# Patient Record
Sex: Female | Born: 1965 | Race: White | Hispanic: No | Marital: Single | Smoking: Current every day smoker
Health system: Southern US, Community
[De-identification: ages and names within clinical notes are randomized; demographics above are authoritative.]

## PROBLEM LIST (undated history)

## (undated) DIAGNOSIS — I10 Essential (primary) hypertension: Secondary | ICD-10-CM

---

## 1998-02-10 ENCOUNTER — Emergency Department (HOSPITAL_COMMUNITY): Admission: EM | Admit: 1998-02-10 | Discharge: 1998-02-10 | Payer: Self-pay | Admitting: Emergency Medicine

## 1998-09-03 ENCOUNTER — Other Ambulatory Visit: Admission: RE | Admit: 1998-09-03 | Discharge: 1998-09-03 | Payer: Self-pay | Admitting: *Deleted

## 1998-09-04 ENCOUNTER — Encounter: Payer: Self-pay | Admitting: Emergency Medicine

## 1998-09-04 ENCOUNTER — Emergency Department (HOSPITAL_COMMUNITY): Admission: EM | Admit: 1998-09-04 | Discharge: 1998-09-04 | Payer: Self-pay | Admitting: Emergency Medicine

## 1999-08-21 ENCOUNTER — Emergency Department (HOSPITAL_COMMUNITY): Admission: EM | Admit: 1999-08-21 | Discharge: 1999-08-21 | Payer: Self-pay | Admitting: *Deleted

## 1999-08-23 ENCOUNTER — Emergency Department (HOSPITAL_COMMUNITY): Admission: EM | Admit: 1999-08-23 | Discharge: 1999-08-23 | Payer: Self-pay | Admitting: *Deleted

## 2000-10-09 ENCOUNTER — Emergency Department (HOSPITAL_COMMUNITY): Admission: EM | Admit: 2000-10-09 | Discharge: 2000-10-09 | Payer: Self-pay | Admitting: Emergency Medicine

## 2001-06-02 ENCOUNTER — Emergency Department (HOSPITAL_COMMUNITY): Admission: EM | Admit: 2001-06-02 | Discharge: 2001-06-02 | Payer: Self-pay | Admitting: Emergency Medicine

## 2001-06-02 ENCOUNTER — Encounter: Payer: Self-pay | Admitting: Emergency Medicine

## 2001-12-24 ENCOUNTER — Encounter: Payer: Self-pay | Admitting: Emergency Medicine

## 2001-12-24 ENCOUNTER — Emergency Department (HOSPITAL_COMMUNITY): Admission: EM | Admit: 2001-12-24 | Discharge: 2001-12-24 | Payer: Self-pay | Admitting: Emergency Medicine

## 2002-01-13 ENCOUNTER — Emergency Department (HOSPITAL_COMMUNITY): Admission: EM | Admit: 2002-01-13 | Discharge: 2002-01-13 | Payer: Self-pay | Admitting: *Deleted

## 2002-12-27 ENCOUNTER — Emergency Department (HOSPITAL_COMMUNITY): Admission: EM | Admit: 2002-12-27 | Discharge: 2002-12-27 | Payer: Self-pay | Admitting: Emergency Medicine

## 2005-09-27 ENCOUNTER — Emergency Department (HOSPITAL_COMMUNITY): Admission: EM | Admit: 2005-09-27 | Discharge: 2005-09-27 | Payer: Self-pay | Admitting: Emergency Medicine

## 2006-07-12 ENCOUNTER — Emergency Department (HOSPITAL_COMMUNITY): Admission: EM | Admit: 2006-07-12 | Discharge: 2006-07-12 | Payer: Self-pay | Admitting: Emergency Medicine

## 2006-07-15 ENCOUNTER — Emergency Department (HOSPITAL_COMMUNITY): Admission: EM | Admit: 2006-07-15 | Discharge: 2006-07-15 | Payer: Self-pay | Admitting: Emergency Medicine

## 2008-10-14 ENCOUNTER — Emergency Department (HOSPITAL_COMMUNITY): Admission: EM | Admit: 2008-10-14 | Discharge: 2008-10-14 | Payer: Self-pay | Admitting: Emergency Medicine

## 2010-12-20 ENCOUNTER — Emergency Department (HOSPITAL_COMMUNITY): Payer: Self-pay

## 2010-12-20 ENCOUNTER — Inpatient Hospital Stay (INDEPENDENT_AMBULATORY_CARE_PROVIDER_SITE_OTHER)
Admission: RE | Admit: 2010-12-20 | Discharge: 2010-12-20 | Disposition: A | Discharge status: Home / Self Care | Payer: Self-pay | Source: Ambulatory Visit | Attending: Family Medicine | Admitting: Family Medicine

## 2010-12-20 ENCOUNTER — Emergency Department (HOSPITAL_COMMUNITY)
Admission: EM | Admit: 2010-12-20 | Discharge: 2010-12-20 | Disposition: A | Discharge status: Home / Self Care | Payer: Self-pay | Attending: Emergency Medicine | Admitting: Emergency Medicine

## 2010-12-20 DIAGNOSIS — R509 Fever, unspecified: Secondary | ICD-10-CM | POA: Insufficient documentation

## 2010-12-20 DIAGNOSIS — B9789 Other viral agents as the cause of diseases classified elsewhere: Secondary | ICD-10-CM | POA: Insufficient documentation

## 2010-12-20 DIAGNOSIS — J329 Chronic sinusitis, unspecified: Secondary | ICD-10-CM | POA: Insufficient documentation

## 2010-12-20 DIAGNOSIS — R1031 Right lower quadrant pain: Secondary | ICD-10-CM | POA: Insufficient documentation

## 2010-12-20 DIAGNOSIS — R51 Headache: Secondary | ICD-10-CM | POA: Insufficient documentation

## 2010-12-20 DIAGNOSIS — R5381 Other malaise: Secondary | ICD-10-CM | POA: Insufficient documentation

## 2010-12-20 DIAGNOSIS — R110 Nausea: Secondary | ICD-10-CM | POA: Insufficient documentation

## 2010-12-20 DIAGNOSIS — J3489 Other specified disorders of nose and nasal sinuses: Secondary | ICD-10-CM | POA: Insufficient documentation

## 2010-12-20 DIAGNOSIS — R05 Cough: Secondary | ICD-10-CM | POA: Insufficient documentation

## 2010-12-20 DIAGNOSIS — R5383 Other fatigue: Secondary | ICD-10-CM | POA: Insufficient documentation

## 2010-12-20 DIAGNOSIS — IMO0001 Reserved for inherently not codable concepts without codable children: Secondary | ICD-10-CM | POA: Insufficient documentation

## 2010-12-20 DIAGNOSIS — R059 Cough, unspecified: Secondary | ICD-10-CM | POA: Insufficient documentation

## 2010-12-20 LAB — CBC
Hemoglobin: 14.7 g/dL (ref 12.0–15.0)
MCH: 31.3 pg (ref 26.0–34.0)
MCV: 92.8 fL (ref 78.0–100.0)
Platelets: 86 10*3/uL — ABNORMAL LOW (ref 150–400)
RBC: 4.69 MIL/uL (ref 3.87–5.11)
RDW: 12.2 % (ref 11.5–15.5)
WBC: 2.1 10*3/uL — ABNORMAL LOW (ref 4.0–10.5)

## 2010-12-20 LAB — DIFFERENTIAL
Eosinophils Absolute: 0 10*3/uL (ref 0.0–0.7)
Eosinophils Relative: 0 % (ref 0–5)
Lymphocytes Relative: 44 % (ref 12–46)
Monocytes Relative: 13 % — ABNORMAL HIGH (ref 3–12)
Neutro Abs: 0.8 10*3/uL — ABNORMAL LOW (ref 1.7–7.7)

## 2010-12-20 LAB — POCT I-STAT, CHEM 8
BUN: <3 mg/dL — ABNORMAL LOW (ref 6–23)
HCT: 45 % (ref 36.0–46.0)
Hemoglobin: 15.3 g/dL — ABNORMAL HIGH (ref 12.0–15.0)
TCO2: 26 mmol/L (ref 0–100)

## 2010-12-20 LAB — POCT PREGNANCY, URINE: Preg Test, Ur: NEGATIVE

## 2010-12-20 LAB — URINALYSIS, ROUTINE W REFLEX MICROSCOPIC
Hgb urine dipstick: NEGATIVE
Urobilinogen, UA: 0.2 mg/dL (ref 0.0–1.0)

## 2010-12-23 LAB — PATHOLOGIST SMEAR REVIEW

## 2010-12-30 LAB — POCT I-STAT, CHEM 8
BUN: 8 mg/dL (ref 6–23)
Chloride: 107 mEq/L (ref 96–112)
Potassium: 4.2 mEq/L (ref 3.5–5.1)
Sodium: 139 mEq/L (ref 135–145)

## 2014-04-23 ENCOUNTER — Emergency Department: Payer: Self-pay | Admitting: Emergency Medicine

## 2014-04-23 LAB — COMPREHENSIVE METABOLIC PANEL
ALK PHOS: 70 U/L
ANION GAP: 13 (ref 7–16)
Albumin: 4.3 g/dL (ref 3.4–5.0)
BILIRUBIN TOTAL: 0.5 mg/dL (ref 0.2–1.0)
BUN: 10 mg/dL (ref 7–18)
CHLORIDE: 102 mmol/L (ref 98–107)
Calcium, Total: 8.9 mg/dL (ref 8.5–10.1)
Co2: 21 mmol/L (ref 21–32)
Creatinine: 0.45 mg/dL — ABNORMAL LOW (ref 0.60–1.30)
EGFR (African American): >60
EGFR (Non-African Amer.): >60
Glucose: 88 mg/dL (ref 65–99)
Osmolality: 270 (ref 275–301)
Potassium: 4.5 mmol/L (ref 3.5–5.1)
SGOT(AST): 39 U/L — ABNORMAL HIGH (ref 15–37)
SGPT (ALT): 27 U/L
Sodium: 136 mmol/L (ref 136–145)
TOTAL PROTEIN: 8.5 g/dL — AB (ref 6.4–8.2)

## 2014-04-23 LAB — CBC WITH DIFFERENTIAL/PLATELET
BASOS ABS: 0.1 10*3/uL (ref 0.0–0.1)
Basophil %: 1 %
EOS ABS: 0.1 10*3/uL (ref 0.0–0.7)
Eosinophil %: 0.9 %
HCT: 45.3 % (ref 35.0–47.0)
HGB: 15.6 g/dL (ref 12.0–16.0)
LYMPHS PCT: 24.2 %
Lymphocyte #: 1.9 10*3/uL (ref 1.0–3.6)
MCH: 33.3 pg (ref 26.0–34.0)
MCHC: 34.4 g/dL (ref 32.0–36.0)
MCV: 97 fL (ref 80–100)
MONO ABS: 0.6 x10 3/mm (ref 0.2–0.9)
MONOS PCT: 7.4 %
Neutrophil #: 5.2 10*3/uL (ref 1.4–6.5)
Neutrophil %: 66.5 %
Platelet: 365 10*3/uL (ref 150–440)
RBC: 4.68 10*6/uL (ref 3.80–5.20)
RDW: 12.3 % (ref 11.5–14.5)
WBC: 7.8 10*3/uL (ref 3.6–11.0)

## 2014-04-23 LAB — TROPONIN I: Troponin-I: <0.02 ng/mL

## 2014-04-23 LAB — LIPASE, BLOOD: LIPASE: 203 U/L (ref 73–393)

## 2014-04-24 LAB — URINALYSIS, COMPLETE
Bilirubin,UR: NEGATIVE
GLUCOSE, UR: NEGATIVE mg/dL (ref 0–75)
Leukocyte Esterase: NEGATIVE
Nitrite: NEGATIVE
PH: 5 (ref 4.5–8.0)
PROTEIN: NEGATIVE
SQUAMOUS EPITHELIAL: NONE SEEN
Specific Gravity: 1.011 (ref 1.003–1.030)
WBC UR: <1 /HPF (ref 0–5)

## 2018-07-28 ENCOUNTER — Ambulatory Visit: Payer: Self-pay | Admitting: Primary Care

## 2020-03-22 ENCOUNTER — Emergency Department
Admission: EM | Admit: 2020-03-22 | Discharge: 2020-03-22 | Disposition: A | Discharge status: Home / Self Care | Payer: Self-pay | Attending: Emergency Medicine | Admitting: Emergency Medicine

## 2020-03-22 ENCOUNTER — Emergency Department: Payer: Self-pay

## 2020-03-22 ENCOUNTER — Other Ambulatory Visit: Payer: Self-pay

## 2020-03-22 ENCOUNTER — Encounter: Payer: Self-pay | Admitting: Emergency Medicine

## 2020-03-22 DIAGNOSIS — M79602 Pain in left arm: Secondary | ICD-10-CM

## 2020-03-22 DIAGNOSIS — M25512 Pain in left shoulder: Secondary | ICD-10-CM | POA: Insufficient documentation

## 2020-03-22 DIAGNOSIS — M791 Myalgia, unspecified site: Secondary | ICD-10-CM | POA: Insufficient documentation

## 2020-03-22 LAB — CBC
HCT: 44.6 % (ref 36.0–46.0)
Hemoglobin: 15.4 g/dL — ABNORMAL HIGH (ref 12.0–15.0)
MCH: 32 pg (ref 26.0–34.0)
MCHC: 34.5 g/dL (ref 30.0–36.0)
MCV: 92.5 fL (ref 80.0–100.0)
Platelets: 306 10*3/uL (ref 150–400)
RBC: 4.82 MIL/uL (ref 3.87–5.11)
RDW: 12.2 % (ref 11.5–15.5)
WBC: 7.7 10*3/uL (ref 4.0–10.5)
nRBC: 0 % (ref 0.0–0.2)

## 2020-03-22 LAB — BASIC METABOLIC PANEL
Anion gap: 10 (ref 5–15)
BUN: 10 mg/dL (ref 6–20)
CO2: 25 mmol/L (ref 22–32)
Calcium: 9.1 mg/dL (ref 8.9–10.3)
Chloride: 105 mmol/L (ref 98–111)
Creatinine, Ser: 0.68 mg/dL (ref 0.44–1.00)
GFR calc Af Amer: >60 mL/min (ref >60)
GFR calc non Af Amer: >60 mL/min (ref >60)
Glucose, Bld: 107 mg/dL — ABNORMAL HIGH (ref 70–99)
Potassium: 4.1 mmol/L (ref 3.5–5.1)
Sodium: 140 mmol/L (ref 135–145)

## 2020-03-22 LAB — TROPONIN I (HIGH SENSITIVITY): Troponin I (High Sensitivity): 2 ng/L (ref <18)

## 2020-03-22 MED ORDER — SODIUM CHLORIDE 0.9% FLUSH: 3.0000 mL | Freq: Once | INTRAVENOUS | Status: DC

## 2020-03-22 MED ORDER — IBUPROFEN 600 MG PO TABS
600.0000 mg | ORAL_TABLET | Freq: Three times a day (TID) | ORAL | 0 refills | Status: DC | PRN
Start: 2020-03-22 — End: 2020-03-22

## 2020-03-22 MED ORDER — DIAZEPAM 5 MG PO TABS
5.0000 mg | ORAL_TABLET | Freq: Three times a day (TID) | ORAL | 0 refills | Status: DC | PRN
Start: 2020-03-22 — End: 2020-03-22

## 2020-03-22 MED ORDER — DIAZEPAM 5 MG PO TABS: 5.0000 mg | ORAL_TABLET | Freq: Three times a day (TID) | ORAL | 0 refills | Status: AC | PRN

## 2020-03-22 MED ORDER — IBUPROFEN 600 MG PO TABS
600.0000 mg | ORAL_TABLET | Freq: Three times a day (TID) | ORAL | 0 refills | Status: AC | PRN
Start: 2020-03-22 — End: ?

## 2020-03-22 NOTE — ED Notes (Signed)
Pt signed esignature.  D/c  inst to pt.  

## 2020-03-22 NOTE — ED Provider Notes (Signed)
ER Provider Note       Time seen: 4:10 PM    I have reviewed the vital signs and the nursing notes.  HISTORY   Chief Complaint Arm Pain and Jaw Pain   HPI Stacy Griffin is a 54 y.o. female with no known past medical history who presents today for left arm pain for the last 2 days.  Patient states this morning the pain radiates into her neck.  Discomfort is 8 out of 10, describes it as an aching.  She feels a popping sensation in the left shoulder.  History reviewed. No pertinent past medical history.  History reviewed. No pertinent surgical history.  Allergies Patient has no known allergies.  Review of Systems Constitutional: Negative for fever. Cardiovascular: Negative for chest pain. Respiratory: Negative for shortness of breath. Gastrointestinal: Negative for abdominal pain, vomiting and diarrhea. Musculoskeletal: Positive for left arm and shoulder pain Skin: Negative for rash. Neurological: Negative for headaches, focal weakness or numbness.  All systems negative/normal/unremarkable except as stated in the HPI  ____________________________________________   PHYSICAL EXAM:  VITAL SIGNS: Vitals:   03/22/20 1205 03/22/20 1214  BP: (!) 194/95   Pulse: (!) 105   Resp:  16  Temp: 98.7 F (37.1 C)   SpO2: 100%     Constitutional: Alert and oriented. Well appearing and in no distress. Eyes: Conjunctivae are normal. Normal extraocular movements. ENT      Head: Normocephalic and atraumatic.      Nose: No congestion/rhinnorhea.      Mouth/Throat: Mucous membranes are moist.      Neck: No stridor. Cardiovascular: Normal rate, regular rhythm. No murmurs, rubs, or gallops. Respiratory: Normal respiratory effort without tachypnea nor retractions. Breath sounds are clear and equal bilaterally. No wheezes/rales/rhonchi. Gastrointestinal: Soft and nontender. Normal bowel sounds Musculoskeletal: Pain with range of motion of the left shoulder, crepitus is noted,  tenderness around the left trapezius muscle group is noted.  No anatomical radiculopathy is noted in the left upper extremity. Neurologic:  Normal speech and language. No gross focal neurologic deficits are appreciated.  Skin:  Skin is warm, dry and intact. No rash noted. Psychiatric: Speech and behavior are normal.  ____________________________________________  EKG: Interpreted by me.  Sinus rhythm with rate of 96 bpm, normal PR interval, normal QRS, normal QT  ____________________________________________   LABS (pertinent positives/negatives)  Labs Reviewed  BASIC METABOLIC PANEL - Abnormal; Notable for the following components:      Result Value   Glucose, Bld 107 (*)    All other components within normal limits  CBC - Abnormal; Notable for the following components:   Hemoglobin 15.4 (*)    All other components within normal limits  POC URINE PREG, ED  TROPONIN I (HIGH SENSITIVITY)  TROPONIN I (HIGH SENSITIVITY)    RADIOLOGY  Chest x-ray is unremarkable  DIFFERENTIAL DIAGNOSIS  Musculoskeletal pain, spasm, anxiety, cervical radiculopathy, MI, dissection  ASSESSMENT AND PLAN  Musculoskeletal pain   Plan: The patient had presented for musculoskeletal pain which seems to be combination of arthritis and muscle spasm in and around the left shoulder. Patient's labs were reassuring, heart score is low risk for ACS.  She is cleared for outpatient follow-up with her doctor.  Daryel November MD    Note: This note was generated in part or whole with voice recognition software. Voice recognition is usually quite accurate but there are transcription errors that can and very often do occur. I apologize for any typographical errors that were not detected  and corrected.     Emily Filbert, MD 03/22/20 [phone]

## 2020-03-22 NOTE — ED Triage Notes (Signed)
C/O constant left arm pain x 2 days.  STates this morning pain now radiates up neck to back of head.    AAOx3.  Skin warm and dry. NAD

## 2020-03-22 NOTE — ED Notes (Signed)
Pt reports left arm pain for 2 days.  No chest pain or sob.  No n/v/d  No cough of rever.  Pt in hallway bed.  Pt alert   Speech clear.

## 2021-06-12 ENCOUNTER — Emergency Department: Payer: Self-pay

## 2021-06-12 ENCOUNTER — Emergency Department
Admission: EM | Admit: 2021-06-12 | Discharge: 2021-06-12 | Disposition: A | Discharge status: Home / Self Care | Payer: Self-pay | Attending: Emergency Medicine | Admitting: Emergency Medicine

## 2021-06-12 ENCOUNTER — Other Ambulatory Visit: Payer: Self-pay

## 2021-06-12 DIAGNOSIS — R42 Dizziness and giddiness: Secondary | ICD-10-CM

## 2021-06-12 DIAGNOSIS — I10 Essential (primary) hypertension: Secondary | ICD-10-CM | POA: Insufficient documentation

## 2021-06-12 DIAGNOSIS — R55 Syncope and collapse: Secondary | ICD-10-CM

## 2021-06-12 DIAGNOSIS — F17200 Nicotine dependence, unspecified, uncomplicated: Secondary | ICD-10-CM | POA: Insufficient documentation

## 2021-06-12 DIAGNOSIS — R200 Anesthesia of skin: Secondary | ICD-10-CM | POA: Insufficient documentation

## 2021-06-12 LAB — CBC
HCT: 49.9 % — ABNORMAL HIGH (ref 36.0–46.0)
Hemoglobin: 17.3 g/dL — ABNORMAL HIGH (ref 12.0–15.0)
MCH: 32.8 pg (ref 26.0–34.0)
MCHC: 34.7 g/dL (ref 30.0–36.0)
MCV: 94.7 fL (ref 80.0–100.0)
Platelets: 276 10*3/uL (ref 150–400)
RBC: 5.27 MIL/uL — ABNORMAL HIGH (ref 3.87–5.11)
RDW: 12.4 % (ref 11.5–15.5)
WBC: 6 10*3/uL (ref 4.0–10.5)
nRBC: 0 % (ref 0.0–0.2)

## 2021-06-12 LAB — BASIC METABOLIC PANEL
Anion gap: 11 (ref 5–15)
BUN: 10 mg/dL (ref 6–20)
CO2: 22 mmol/L (ref 22–32)
Calcium: 9 mg/dL (ref 8.9–10.3)
Chloride: 108 mmol/L (ref 98–111)
Creatinine, Ser: 0.77 mg/dL (ref 0.44–1.00)
GFR, Estimated: >60 mL/min (ref >60)
Glucose, Bld: 114 mg/dL — ABNORMAL HIGH (ref 70–99)
Potassium: 4 mmol/L (ref 3.5–5.1)
Sodium: 141 mmol/L (ref 135–145)

## 2021-06-12 MED ORDER — LOSARTAN POTASSIUM 25 MG PO TABS: 25.0000 mg | ORAL_TABLET | Freq: Every day | ORAL | 1 refills | Status: AC

## 2021-06-12 MED ORDER — MECLIZINE HCL 25 MG PO TABS
25.0000 mg | ORAL_TABLET | Freq: Once | ORAL | Status: AC
  Administered 2021-06-12: 25 mg via ORAL
  Filled 2021-06-12: qty 1

## 2021-06-12 MED ORDER — IOHEXOL 350 MG/ML SOLN
75.0000 mL | Freq: Once | INTRAVENOUS | Status: AC | PRN
  Administered 2021-06-12: 75 mL via INTRAVENOUS
  Filled 2021-06-12: qty 75

## 2021-06-12 MED ORDER — LOSARTAN POTASSIUM 50 MG PO TABS
25.0000 mg | ORAL_TABLET | Freq: Every day | ORAL | Status: DC
  Administered 2021-06-12: 25 mg via ORAL
  Filled 2021-06-12: qty 1

## 2021-06-12 NOTE — ED Triage Notes (Signed)
Pt to ED [facility] from work for near syncope after having dizziness. Denies cp.  Reports shob earlier.  Denies hx of HTN  MSE Darl Pikes PA

## 2021-06-12 NOTE — ED Provider Notes (Signed)
Emergency Medicine Provider Triage Evaluation Note  Stacy Griffin , a 55 y.o. female  was evaluated in triage.  Pt complains of dizziness and numbness tingling to both hands and feels weaker on the right than the left..  Review of Systems  Positive: Dizziness, weakness Negative: Headache, chest pain, shortness of breath, abdominal pain  Physical Exam  BP (!) 174/97   Pulse 91   Temp 97.8 F (36.6 C) (Oral)   Resp 18   Ht 5\' 2"  (1.575 m)   Wt 72.6 kg   SpO2 96%   BMI 29.26 kg/m  Gen:   Awake, no distress   Resp:  Normal effort  MSK:   Moves extremities without difficulty  Other:  PERRL, EOMI, multiple beats nystagmus noted bilaterally in the horizontal field  Medical Decision Making  Medically screening exam initiated at 11:52 AM.  Appropriate orders placed.  was informed that the remainder of the evaluation will be completed by another provider, this initial triage assessment does not replace that evaluation, and the importance of remaining in the ED until their evaluation is complete.     Sheilah Pigeon, PA-C 06/12/21 1152    06/14/21, MD 06/12/21 [phone]

## 2021-06-12 NOTE — ED Provider Notes (Signed)
[facility]-EMERGENCY DEPARTMENT  ____________________________________________  Time seen: Approximately 8:11 PM  I have reviewed the triage vital signs and the nursing notes.   HISTORY  Chief Complaint Dizziness   Historian Patient     HPI Stacy Griffin is a 55 y.o. female presents to the emergency department with concern for a possible episode of syncope that occurred at work.  Patient reports that she was experiencing a typical workday when she "blacked out".  Patient states that she experienced immediate confusion and did not know where she was.  She states that she had tingling in the bilateral hands and her speech was slurred.  She states that she had difficulty coordinating movements.  She states that her symptoms have improved significantly but she does not feel completely back to baseline.  Patient denies a history of hypertension and is unaware that her blood pressure was elevated.  Denies a history of DVT or PE in the past.  Denies chest pain, chest tightness or shortness of breath.  Denies current weakness in the arms and legs.  Denies experiencing similar symptoms in the past.   History reviewed. No pertinent past medical history.   Immunizations up to date:  Yes.     History reviewed. No pertinent past medical history.  There are no problems to display for this patient.   History reviewed. No pertinent surgical history.  Prior to Admission medications   Medication Sig Start Date End Date Taking? Authorizing Provider  losartan (COZAAR) 25 MG tablet Take 1 tablet (25 mg total) by mouth daily. 06/12/21 07/12/21 Yes Pia Mau M, PA-C  diazepam (VALIUM) 5 MG tablet Take 1 tablet (5 mg total) by mouth every 8 (eight) hours as needed for muscle spasms. Patient not taking: Reported on 06/12/2021 03/22/20   Emily Filbert, MD  ibuprofen (ADVIL) 600 MG tablet Take 1 tablet (600 mg total) by mouth every 8 (eight) hours as needed. Patient not taking: Reported on  06/12/2021 03/22/20   Emily Filbert, MD    Allergies Patient has no known allergies.  History reviewed. No pertinent family history.  Social History Social History   Tobacco Use   Smoking status: Every Day   Smokeless tobacco: Never  Substance Use Topics   Alcohol use: Not Currently   Drug use: Not Currently     Review of Systems  Constitutional: No fever/chills Eyes:  No discharge ENT: No upper respiratory complaints. Respiratory: no cough. No SOB/ use of accessory muscles to breath Gastrointestinal:   No nausea, no vomiting.  No diarrhea.  No constipation. Musculoskeletal: Negative for musculoskeletal pain. Neuro: Patient has headache.  Skin: Negative for rash, abrasions, lacerations, ecchymosis.    ____________________________________________   PHYSICAL EXAM:  VITAL SIGNS: ED Triage Vitals  Enc Vitals Group     BP 06/12/21 1145 (!) 174/97     Pulse Rate 06/12/21 1145 91     Resp 06/12/21 1145 18     Temp 06/12/21 1145 97.8 F (36.6 C)     Temp Source 06/12/21 1145 Oral     SpO2 06/12/21 1145 96 %     Weight 06/12/21 1146 160 lb (72.6 kg)     Height 06/12/21 1146 5\' 2"  (1.575 m)     Head Circumference --      Peak Flow --      Pain Score 06/12/21 1146 0     Pain Loc --      Pain Edu? --      Excl. in GC? --  Constitutional: Alert and oriented. Well appearing and in no acute distress. Eyes: Conjunctivae are normal. PERRL. EOMI. Head: Atraumatic. ENT:      Ears: Tms are pearly.       Nose: No congestion/rhinnorhea.      Mouth/Throat: Mucous membranes are moist.  Neck: No stridor.  No cervical spine tenderness to palpation. Cardiovascular: Normal rate, regular rhythm. Normal S1 and S2.  Good peripheral circulation. Respiratory: Normal respiratory effort without tachypnea or retractions. Lungs CTAB. Good air entry to the bases with no decreased or absent breath sounds Gastrointestinal: Bowel sounds x 4 quadrants. Soft and nontender to  palpation. No guarding or rigidity. No distention. Musculoskeletal: Patient has symmetric strength in the upper and lower extremities. Neurologic:  Normal for age. No gross focal neurologic deficits are appreciated.  Patient can perform rapid alternating movements.  Cranial nerves II through XII are intact. Skin:  Skin is warm, dry and intact. No rash noted. Psychiatric: Mood and affect are normal for age. Speech and behavior are normal.   ____________________________________________   LABS (all labs ordered are listed, but only abnormal results are displayed)  Labs Reviewed  BASIC METABOLIC PANEL - Abnormal; Notable for the following components:      Result Value   Glucose, Bld 114 (*)    All other components within normal limits  CBC - Abnormal; Notable for the following components:   RBC 5.27 (*)    Hemoglobin 17.3 (*)    HCT 49.9 (*)    All other components within normal limits  URINALYSIS, COMPLETE (UACMP) WITH MICROSCOPIC   ____________________________________________  EKG   ____________________________________________  RADIOLOGY Geraldo Pitter, personally viewed and evaluated these images (plain radiographs) as part of my medical decision making, as well as reviewing the written report by the radiologist.  CT ANGIO HEAD NECK W WO CM  Result Date: 06/12/2021 CLINICAL DATA:  Dizziness with numbness and tingling in both hands EXAM: CT ANGIOGRAPHY HEAD AND NECK TECHNIQUE: Multidetector CT imaging of the head and neck was performed using the standard protocol during bolus administration of intravenous contrast. Multiplanar CT image reconstructions and MIPs were obtained to evaluate the vascular anatomy. Carotid stenosis measurements (when applicable) are obtained utilizing NASCET criteria, using the distal internal carotid diameter as the denominator. CONTRAST:  60mL OMNIPAQUE IOHEXOL 350 MG/ML SOLN COMPARISON:  None. FINDINGS: CT HEAD FINDINGS Brain: There is no mass,  hemorrhage or extra-axial collection. The size and configuration of the ventricles and extra-axial CSF spaces are normal. There is no acute or chronic infarction. The brain parenchyma is normal. Skull: The visualized skull base, calvarium and extracranial soft tissues are normal. Sinuses/Orbits: No fluid levels or advanced mucosal thickening of the visualized paranasal sinuses. No mastoid or middle ear effusion. The orbits are normal. CTA NECK FINDINGS SKELETON: There is no bony spinal canal stenosis. No lytic or blastic lesion. OTHER NECK: Normal pharynx, larynx and major salivary glands. No cervical lymphadenopathy. Unremarkable thyroid gland. UPPER CHEST: No pneumothorax or pleural effusion. No nodules or masses. AORTIC ARCH: There is no calcific atherosclerosis of the aortic arch. There is no aneurysm, dissection or hemodynamically significant stenosis of the visualized portion of the aorta. Conventional 3 vessel aortic branching pattern. The visualized proximal subclavian arteries are widely patent. RIGHT CAROTID SYSTEM: No dissection, occlusion or aneurysm. Mild atherosclerotic calcification at the carotid bifurcation without hemodynamically significant stenosis. LEFT CAROTID SYSTEM: Normal without aneurysm, dissection or stenosis. VERTEBRAL ARTERIES: Left dominant configuration. Both origins are clearly patent. There is no dissection,  occlusion or flow-limiting stenosis to the skull base (V1-V3 segments). CTA HEAD FINDINGS POSTERIOR CIRCULATION: --Vertebral arteries: Normal V4 segments. --Inferior cerebellar arteries: Normal. --Basilar artery: Normal. --Superior cerebellar arteries: Normal. --Posterior cerebral arteries (PCA): Normal. ANTERIOR CIRCULATION: --Intracranial internal carotid arteries: Normal. --Anterior cerebral arteries (ACA): Normal. Both A1 segments are present. Patent anterior communicating artery (a-comm). --Middle cerebral arteries (MCA): Normal. VENOUS SINUSES: As permitted by contrast  timing, patent. ANATOMIC VARIANTS: None Review of the MIP images confirms the above findings. IMPRESSION: Normal CTA of the head and neck. Electronically Signed   By: Deatra Robinson M.D.   On: 06/12/2021 20:49   CT HEAD WO CONTRAST ( )  Result Date: 06/12/2021 CLINICAL DATA:  Dizziness and hypertension.  Tachycardia.  Syncope. EXAM: CT HEAD WITHOUT CONTRAST TECHNIQUE: Contiguous axial images were obtained from the base of the skull through the vertex without intravenous contrast. COMPARISON:  09/27/2005 FINDINGS: Brain: The brainstem, cerebellum, cerebral peduncles, thalami, basal ganglia, basilar cisterns, and ventricular system appear within normal limits. No intracranial hemorrhage, mass lesion, or acute CVA. Vascular: Unremarkable Skull: Unremarkable Sinuses/Orbits: Slightly asymmetric soft tissue prominence in the middle ear medial to the right ossicular chain on images 15-16 of series 3 probably represents a mildly asymmetrically prominent tensor tympani muscle rather than cholesteatoma or inflammation. I reviewed this appearance with Dr. Guadlupe Spanish, who concurred. Other: No supplemental non-categorized findings. IMPRESSION: 1. No significant abnormality identified. Electronically Signed   By: Gaylyn Rong M.D.   On: 06/12/2021 13:07    ____________________________________________    PROCEDURES  Procedure(s) performed:     Procedures     Medications  losartan (COZAAR) tablet 25 mg (25 mg Oral Given 06/12/21 2119)  meclizine (ANTIVERT) tablet 25 mg (25 mg Oral Given 06/12/21 1159)  iohexol (OMNIPAQUE) 350 MG/ML injection 75 mL (75 mLs Intravenous Contrast Given 06/12/21 2033)     ____________________________________________   INITIAL IMPRESSION / ASSESSMENT AND PLAN / ED COURSE  Pertinent labs & imaging results that were available during my care of the patient were reviewed by me and considered in my medical decision making (see chart for details).    Assessment and  plan Headache 55 year old female presents to the emergency department after an episode of syncope that occurred at work.  Patient reports that she blacked out momentarily and then afterwards experienced dizziness, confusion, slurred speech and difficulty coordinating her movements.  Patient was hypertensive at triage without other notable neurodeficits on exam.  CT head showed no evidence of intracranial bleed.  Patient declined MRI.  As patient does not currently feel back to baseline, will obtain CT angio head and neck.  I reviewed care plan with attending Dr. Vicente Males who agrees at this time.  Patient had a normal CTA of the head and neck.  Patient stated that she felt improved and was ready to be discharged.  I started patient on losartan daily until she can be seen by her primary care provider.  Return precautions were given to return with new or worsening symptoms.   ____________________________________________  FINAL CLINICAL IMPRESSION(S) / ED DIAGNOSES  Final diagnoses:  Dizziness  Syncope, unspecified syncope type  Hypertension, unspecified type      NEW MEDICATIONS STARTED DURING THIS VISIT:  ED Discharge Orders          Ordered    losartan (COZAAR) 25 MG tablet  Daily        06/12/21 2055                This chart  was dictated using voice recognition software/Dragon. Despite best efforts to proofread, errors can occur which can change the meaning. Any change was purely unintentional.     Gasper Lloyd 06/12/21 2322    Minna Antis, MD 06/13/21 1924

## 2021-06-12 NOTE — ED Notes (Signed)
Patient transported to CT 

## 2021-06-12 NOTE — Discharge Instructions (Addendum)
Take 25 mg of losartan once daily for hypertension.

## 2021-06-12 NOTE — ED Triage Notes (Signed)
Pt comes into the ED via [facility] from her work c/o hypertension.  Pt also presents tachycardic with a syncopal episode.  Pt does not have any known history of HTN.  Pt c/o dizziness, but denies any Chest pain or blurred vision.  CBG 120 12 lead, sinus tach 199/117 99% RA 110 HR

## 2022-03-18 ENCOUNTER — Emergency Department (HOSPITAL_COMMUNITY)
Admission: EM | Admit: 2022-03-18 | Discharge: 2022-03-18 | Disposition: A | Discharge status: Home / Self Care | Payer: Self-pay | Attending: Emergency Medicine | Admitting: Emergency Medicine

## 2022-03-18 ENCOUNTER — Emergency Department (HOSPITAL_COMMUNITY): Payer: Self-pay

## 2022-03-18 ENCOUNTER — Encounter (HOSPITAL_COMMUNITY): Payer: Self-pay

## 2022-03-18 ENCOUNTER — Other Ambulatory Visit: Payer: Self-pay

## 2022-03-18 DIAGNOSIS — S8251XA Displaced fracture of medial malleolus of right tibia, initial encounter for closed fracture: Secondary | ICD-10-CM | POA: Insufficient documentation

## 2022-03-18 DIAGNOSIS — S82831A Other fracture of upper and lower end of right fibula, initial encounter for closed fracture: Secondary | ICD-10-CM | POA: Insufficient documentation

## 2022-03-18 DIAGNOSIS — W109XXA Fall (on) (from) unspecified stairs and steps, initial encounter: Secondary | ICD-10-CM | POA: Insufficient documentation

## 2022-03-18 HISTORY — DX: Essential (primary) hypertension: I10

## 2022-03-18 MED ORDER — HYDROCODONE-ACETAMINOPHEN 5-325 MG PO TABS
1.0000 | ORAL_TABLET | Freq: Once | ORAL | Status: AC
  Administered 2022-03-18: 1 via ORAL
  Filled 2022-03-18: qty 1

## 2022-03-18 MED ORDER — HYDROCODONE-ACETAMINOPHEN 5-325 MG PO TABS: 1.0000 | ORAL_TABLET | Freq: Four times a day (QID) | ORAL | 0 refills | Status: AC | PRN

## 2022-03-18 NOTE — Progress Notes (Signed)
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  Ortho Devices Type of Ortho Device: Crutches Ortho Device/Splint Location: BLE Ortho Device/Splint Interventions: Ordered, Adjustment   Post Interventions Patient Tolerated: Well Instructions Provided: Poper ambulation with device  Jad Johansson A Esty Ahuja 03/18/2022, 4:50 PM

## 2022-03-18 NOTE — ED Triage Notes (Signed)
Pt states she was down the stairs and her flip flop got caught and she rolled both anklesx3 days. Pt states she has been having trouble walking since. Pt has 2+ swelling of ankle bilat. Pt has ecchymosis of anterior both ankles. Pt has 2+ pedal pulse bilat, warm to touch, cap refill less than 3 sec, pt able to wiggle toes.

## 2022-03-18 NOTE — ED Provider Notes (Signed)
[facility] EMERGENCY DEPARTMENT Provider Note   CSN: [id number] Arrival date & time: 03/18/22  1514     History  Chief Complaint  Patient presents with   Ankle Injury    Stacy Griffin is a 56 y.o. female with past medical history significant for hypertension who presents with concern for pain to bilateral ankles for the last 3 days.  Patient reports that she fell down the stairs, when her flip-flop got caught and rolled on both ankles.  Patient reports that she is been having difficulty walking since.  Patient reports that she has been taking 1000 mg of Tylenol every 6 hours with minimal relief.  She reports 6/10 pain at rest, and significant pain when trying to weight-bear.  She reports she knew something was wrong because it feels significantly worse than previous ankle sprains.  She denies any numbness, tingling of the feet or toes.   Ankle Injury       Home Medications Prior to Admission medications   Medication Sig Start Date End Date Taking? Authorizing Provider  HYDROcodone-acetaminophen (NORCO/VICODIN) 5-325 MG tablet Take 1-2 tablets by mouth every 6 (six) hours as needed. 03/18/22  Yes Kenyada Dosch H, PA-C  diazepam (VALIUM) 5 MG tablet Take 1 tablet (5 mg total) by mouth every 8 (eight) hours as needed for muscle spasms. Patient not taking: Reported on 06/12/2021 03/22/20   Earleen Newport, MD  ibuprofen (ADVIL) 600 MG tablet Take 1 tablet (600 mg total) by mouth every 8 (eight) hours as needed. Patient not taking: Reported on 06/12/2021 03/22/20   Earleen Newport, MD  losartan (COZAAR) 25 MG tablet Take 1 tablet (25 mg total) by mouth daily. 06/12/21 07/12/21  Lannie Fields, PA-C      Allergies    Patient has no known allergies.    Review of Systems   Review of Systems  Musculoskeletal:  Positive for gait problem and joint swelling.  All other systems reviewed and are negative.   Physical Exam Updated Vital Signs BP (!) 152/76  (BP Location: Right Arm)   Pulse 90   Temp 98.4 F (36.9 C) (Oral)   Resp 18   Ht 5\' 2"  (1.575 m)   Wt 72.6 kg   SpO2 96%   BMI 29.27 kg/m  Physical Exam Vitals and nursing note reviewed.  Constitutional:      General: She is not in acute distress.    Appearance: Normal appearance.  HENT:     Head: Normocephalic and atraumatic.  Eyes:     General:        Right eye: No discharge.        Left eye: No discharge.  Cardiovascular:     Rate and Rhythm: Normal rate and regular rhythm.     Pulses: Normal pulses.  Pulmonary:     Effort: Pulmonary effort is normal. No respiratory distress.  Musculoskeletal:        General: Swelling and deformity present.     Comments: Significant tenderness to lateral right ankle with soft tissue swelling, bruising.  Tenderness to palpation most focally medial left ankle additionally with some soft tissue swelling and bruising.  Some tenderness palpation along left-sided ATFL ligament as well.  Intact range of motion, intact strength to dorsiflexion, plantarflexion on bilateral ankles with some pain.  Patient can move all toes spontaneously.  Skin:    General: Skin is warm and dry.     Capillary Refill: Capillary refill takes less than 2 seconds.  Neurological:     Mental Status: She is alert and oriented to person, place, and time.     Sensory: No sensory deficit.  Psychiatric:        Mood and Affect: Mood normal.        Behavior: Behavior normal.     ED Results / Procedures / Treatments   Labs (all labs ordered are listed, but only abnormal results are displayed) Labs Reviewed - No data to display  EKG None  Radiology DG Ankle Complete Left  Result Date: 03/18/2022 CLINICAL DATA:  Bilateral ankle pain/swelling post "twisting" them x today. EXAM: LEFT ANKLE COMPLETE - 3+ VIEW COMPARISON:  None Available. FINDINGS: Acute minimally displaced medial malleolar avulsion fracture. No dislocation. Possible small joint effusion. There is no evidence  of arthropathy or other focal bone abnormality. Subcutaneus soft tissue edema. IMPRESSION: Acute minimally displaced medial malleolar avulsion fracture. Electronically Signed   By: Tish Frederickson M.D.   On: 03/18/2022 16:12   DG Ankle Complete Right  Result Date: 03/18/2022 CLINICAL DATA:  Bilateral ankle injury. EXAM: RIGHT ANKLE - COMPLETE 3+ VIEW COMPARISON:  None Available. FINDINGS: Oblique fracture of the distal fibula noted at the level of the ankle mortise. There is mild lateral distraction of the distal fragment without gross subluxation or dislocation at the tibiotalar joint. No fracture of the distal tibia. Soft tissue swelling evident. IMPRESSION: Oblique fracture of the distal fibula with mild lateral distraction of the distal fragment. Electronically Signed   By: Kennith Center M.D.   On: 03/18/2022 16:03    Procedures Procedures    Medications Ordered in ED Medications  HYDROcodone-acetaminophen (NORCO/VICODIN) 5-325 MG per tablet 1 tablet (has no administration in time range)    ED Course/ Medical Decision Making/ A&P                           Medical Decision Making  Is an overall well-appearing 56 year old female who presents with concern for bilateral ankle pain after fall 3 days ago, twisting injury.  Patient reports that she has been having significant pain with weightbearing.  My emergent differential diagnosis includes acute fracture, dislocation, or other injury of the ankle.  Also considered severe sprain.  Patient with significant past medical history of hypertension.  No past medical history of diabetes.  Patient with neurovascularly intact bilateral lower extremities, intact pulses, but significant tenderness and swelling, as well as bruising to lateral right ankle, as well as medial left ankle.  Patient can weight-bear with some difficulty.  Intact range of motion to plantarflexion, dorsiflexion bilaterally with some pain.   I independently interpreted imaging  including plain film radiographs of the right ankle, left ankle distal right fibula fracture with minor angulation of fractured fragment.  Very small avulsion fracture noted to left medial malleolus, consistent with severe sprain. I agree with the radiologist interpretation.  We will place patient in bilateral cam boot, encouraged ibuprofen, Tylenol, rest, ice, compression, elevation of affected extremities, minimal weightbearing as tolerated, and encourage close follow-up with orthopedics for rehab planning.  Patient will be discharged in stable condition, extensive return precautions given.  Final Clinical Impression(s) / ED Diagnoses Final diagnoses:  Other closed fracture of distal end of right fibula, initial encounter  Closed avulsion fracture of medial malleolus of right tibia, initial encounter    Rx / DC Orders ED Discharge Orders          Ordered    HYDROcodone-acetaminophen (NORCO/VICODIN) 5-325  MG tablet  Every 6 hours PRN        03/18/22 1651              Susana Duell, Edyth Gunnels 03/18/22 1652    Linwood Dibbles, MD 03/19/22 [id number]

## 2022-03-18 NOTE — Discharge Instructions (Signed)
Please use Tylenol or ibuprofen for pain.  You may use 600 mg ibuprofen every 6 hours or 1000 mg of Tylenol every 6 hours.  You may choose to alternate between the 2.  This would be most effective.  Not to exceed 4 g of Tylenol within 24 hours.  Not to exceed 3200 mg ibuprofen 24 hours.  You can use the narcotic pain medication I am prescribing to help with breakthrough pain, please take it in place of Tylenol in the above description as the narcotic pain medication already contains Tylenol.  Please follow-up with orthopedics at your earliest convenience for further evaluation and rehab planning.

## 2022-03-18 NOTE — ED Provider Triage Note (Signed)
Emergency Medicine Provider Triage Evaluation Note  Stacy Griffin , a 56 y.o. female  was evaluated in triage.  Pt complains of bilateral ankle pain after falling down the stairs on Saturday night. Tried using ice and elevating her ankles without relief. Able to bear weight with severe pain.   Review of Systems  Positive: Bilateral ankle pain and swelling Negative: Numbness   Physical Exam  BP (!) 152/76 (BP Location: Right Arm)   Pulse 90   Temp 98.4 F (36.9 C) (Oral)   Resp 18   SpO2 96%  Gen:   Awake, no distress   Resp:  Normal effort  MSK:   Moves extremities without difficulty  Other:  Bilateral soft tissue swelling of the ankles with diffuse ecchymoses, neurovascularly intact  Medical Decision Making  Medically screening exam initiated at 3:28 PM.  Appropriate orders placed.  Sheilah Pigeon was informed that the remainder of the evaluation will be completed by another provider, this initial triage assessment does not replace that evaluation, and the importance of remaining in the ED until their evaluation is complete.  Workup initiated including x-rays   Maela Takeda T, PA-C 03/18/22 1534

## 2022-06-02 IMAGING — CT CT HEAD W/O CM
3 series · 16 of 47 positions shown, 19 images · non-contrast
Comparison: 09/27/2005

CLINICAL DATA: Dizziness and hypertension.  Tachycardia.  Syncope.

EXAM:
CT HEAD WITHOUT CONTRAST
TECHNIQUE: Contiguous axial images were obtained from the base of the skull
through the vertex without intravenous contrast.

[Series 2: head wo · axial · 0.45mm/px · z∈[-78,+62]mm · 10 of 34 slices shown, 13 images]
[im 3/34  brain]
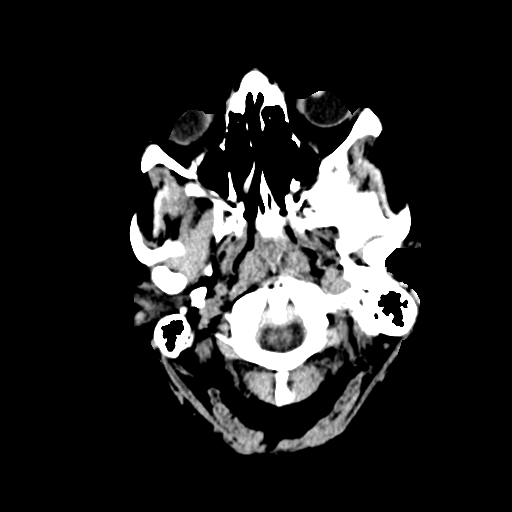
[im 3/34  bone]
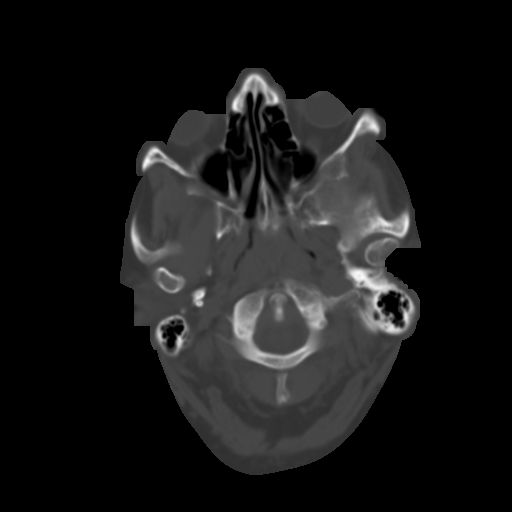
[im 6/34  brain]
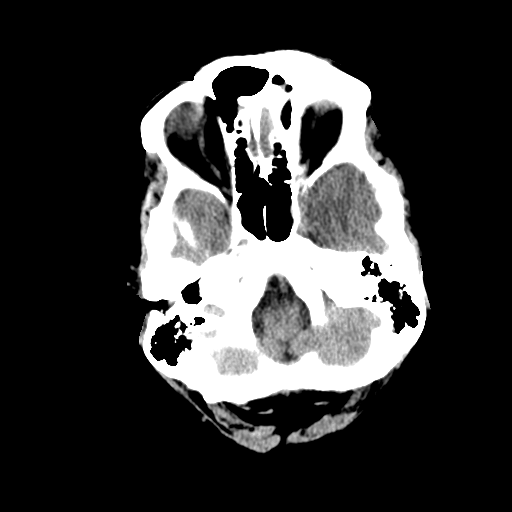
[im 10/34  brain]
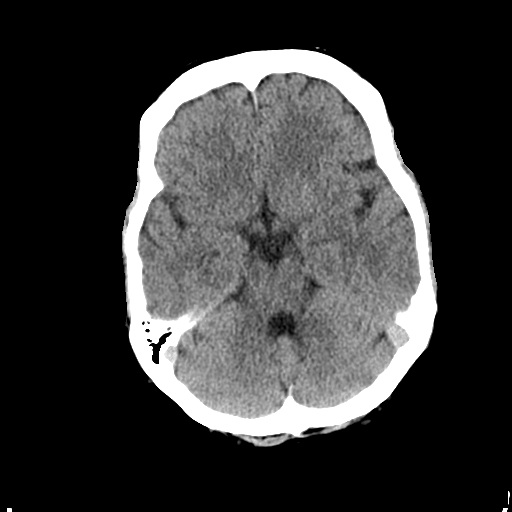
[im 12/34  brain]
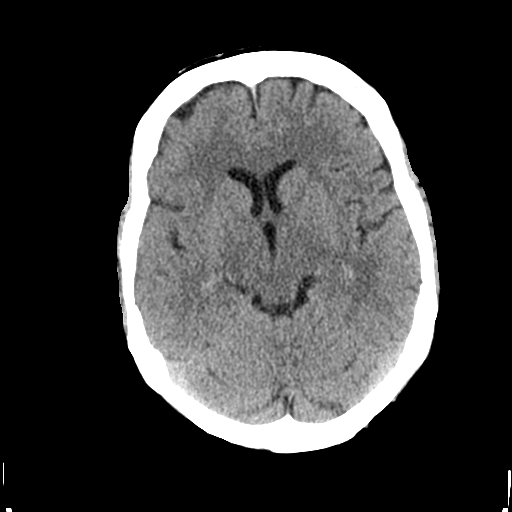
[im 15/34  brain]
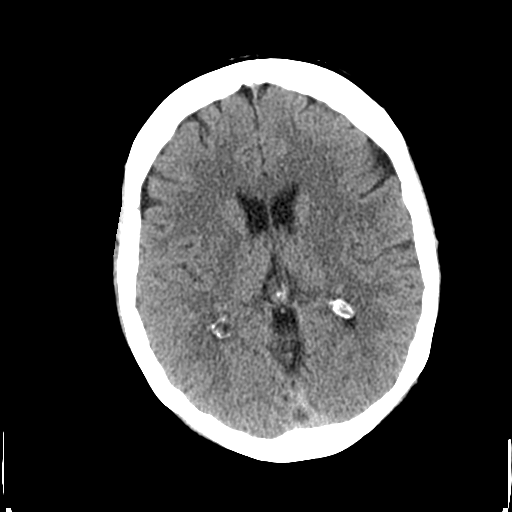
[im 15/34  bone]
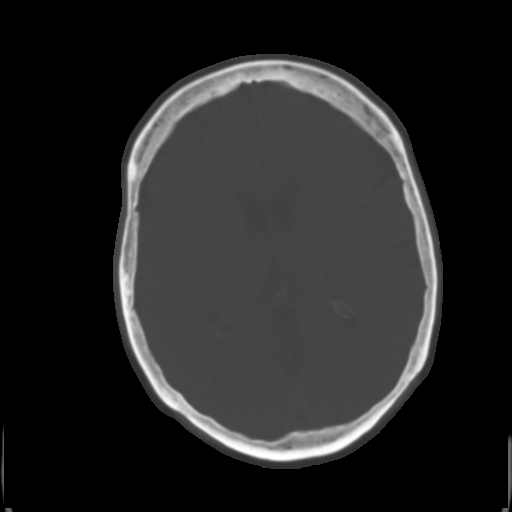
[im 19/34  brain]
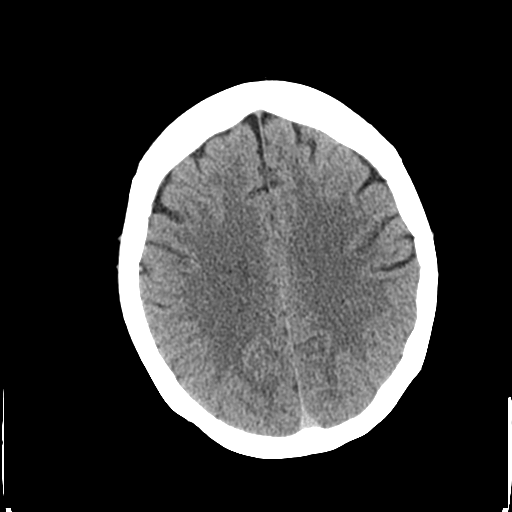
[im 22/34  brain]
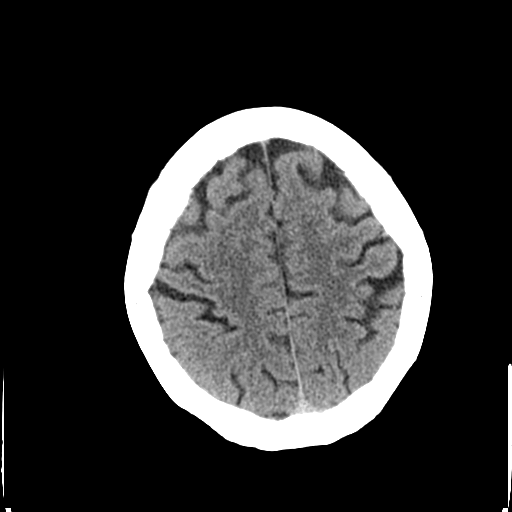
[im 26/34  brain]
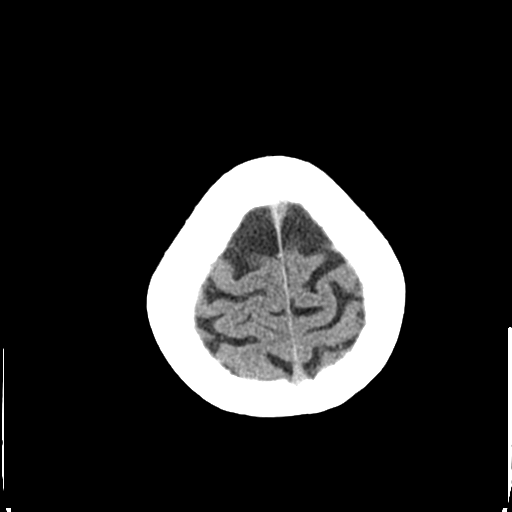
[im 28/34  brain]
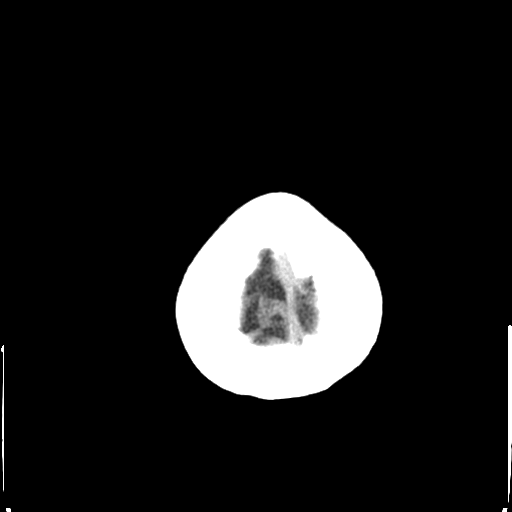
[im 28/34  bone]
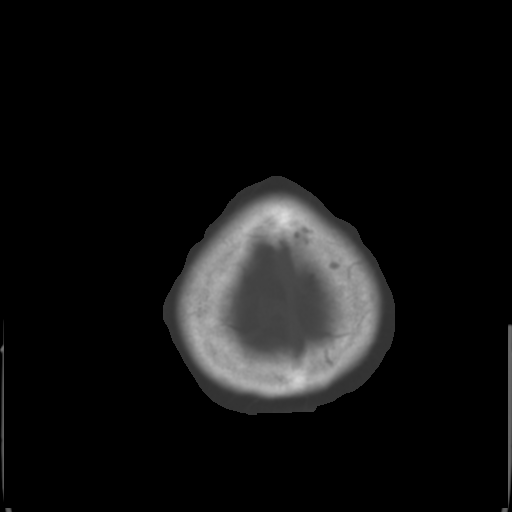
[im 31/34  brain]
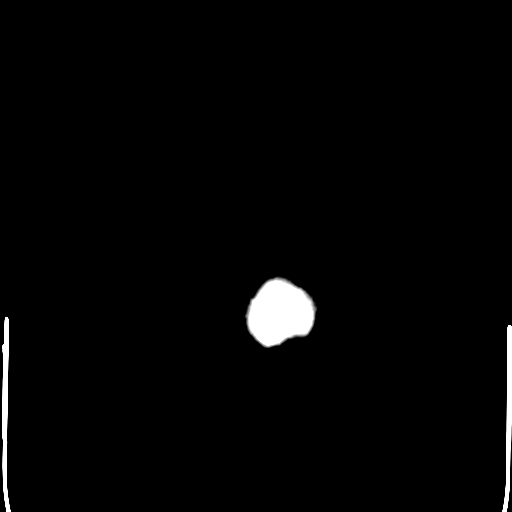

[Series 4: coronal soft tissue · coronal · 0.35mm/px · 3 of 72 slices shown]
[im 24/72  brain]
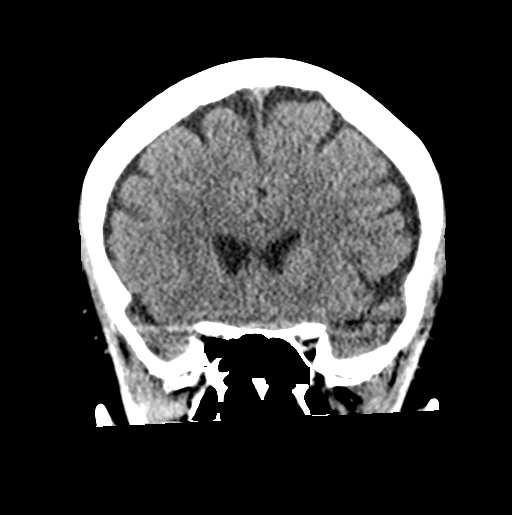
[im 32/72  brain]
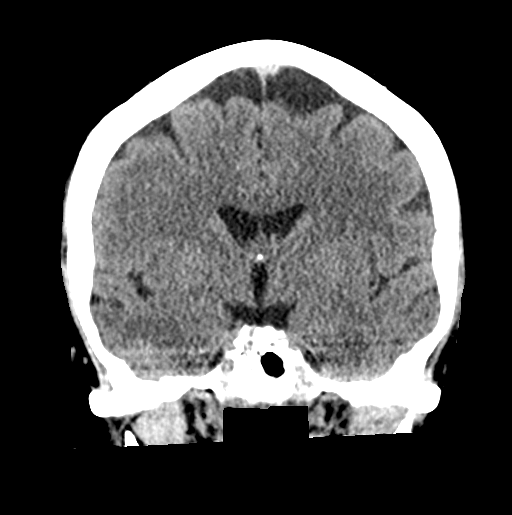
[im 40/72  brain]
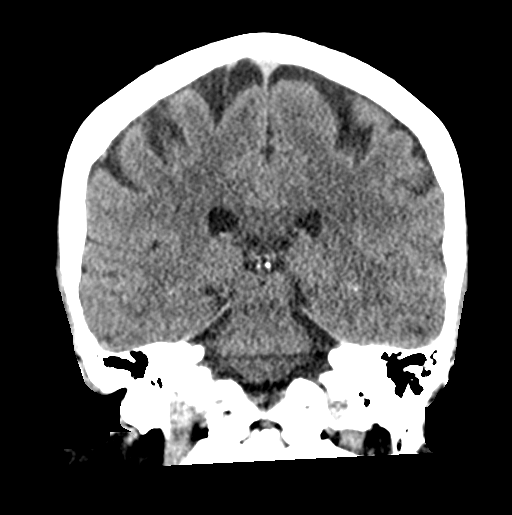

[Series 5: sagittal soft tissue · sagittal · 0.38mm/px · 3 of 58 slices shown]
[im 20/58  brain]
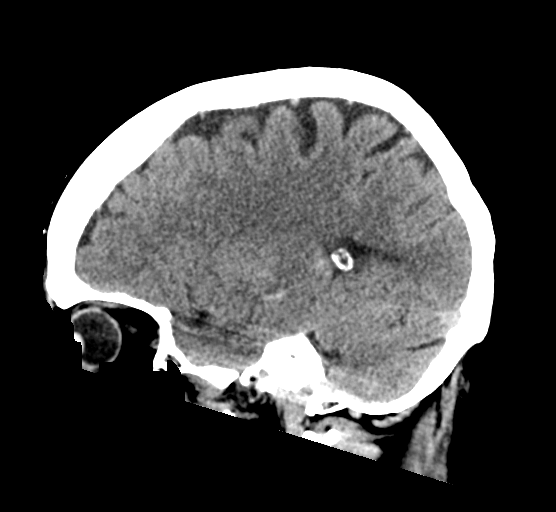
[im 29/58  brain]
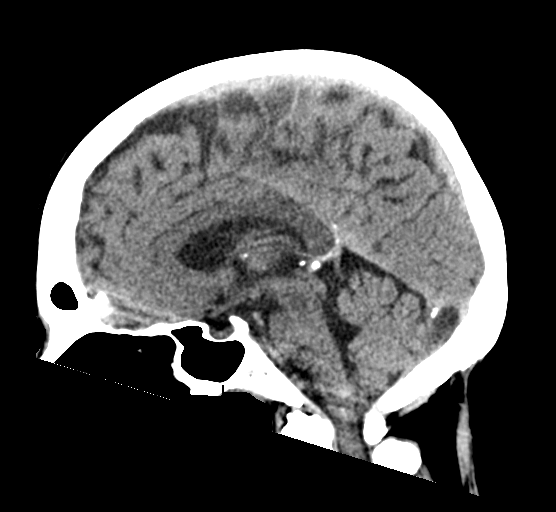
[im 39/58  brain]
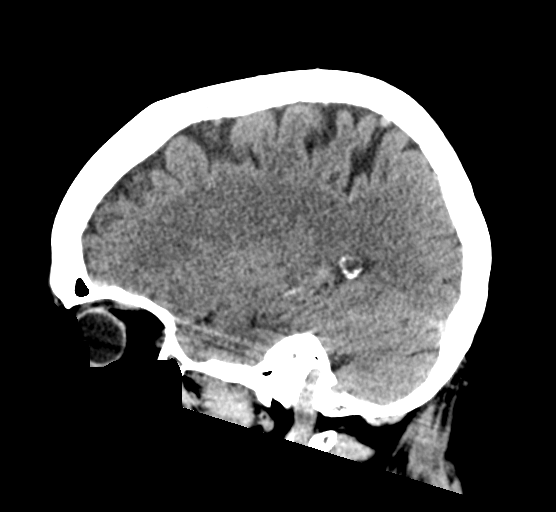

[16 of 47 positions shown; findings below may reference images not displayed]

FINDINGS: Brain: The brainstem, cerebellum, cerebral peduncles, thalami, basal
ganglia, basilar cisterns, and ventricular system appear within
normal limits. No intracranial hemorrhage, mass lesion, or acute
CVA.

Vascular: Unremarkable

Skull: Unremarkable

Sinuses/Orbits: Slightly asymmetric soft tissue prominence in the
middle ear medial to the right ossicular chain on images 15-16 of
series 3 probably represents a mildly asymmetrically prominent
tensor tympani muscle rather than cholesteatoma or inflammation. I
reviewed this appearance with Dr. Will-Fried Yunzuguru, who concurred.

Other: No supplemental non-categorized findings.
IMPRESSION: 1. No significant abnormality identified.

## 2023-10-21 ENCOUNTER — Other Ambulatory Visit: Payer: Self-pay | Admitting: Student

## 2023-10-21 DIAGNOSIS — Z1231 Encounter for screening mammogram for malignant neoplasm of breast: Secondary | ICD-10-CM

## 2023-10-23 LAB — EXTERNAL GENERIC LAB PROCEDURE: COLOGUARD: NEGATIVE
